# Patient Record
Sex: Male | Born: 1980 | Hispanic: Yes | Marital: Married | Smoking: Never smoker
Health system: Southern US, Community
[De-identification: ages and names within clinical notes are randomized; demographics above are authoritative.]

## PROBLEM LIST (undated history)

## (undated) DIAGNOSIS — G4710 Hypersomnia, unspecified: Secondary | ICD-10-CM

## (undated) DIAGNOSIS — R55 Syncope and collapse: Secondary | ICD-10-CM

## (undated) HISTORY — DX: Hypersomnia, unspecified: G47.10

## (undated) HISTORY — DX: Syncope and collapse: R55

---

## 2016-10-20 ENCOUNTER — Ambulatory Visit (INDEPENDENT_AMBULATORY_CARE_PROVIDER_SITE_OTHER): Payer: Worker's Compensation

## 2016-10-20 ENCOUNTER — Ambulatory Visit (INDEPENDENT_AMBULATORY_CARE_PROVIDER_SITE_OTHER): Payer: Worker's Compensation | Admitting: Orthopaedic Surgery

## 2016-10-20 DIAGNOSIS — M5412 Radiculopathy, cervical region: Secondary | ICD-10-CM

## 2016-10-20 DIAGNOSIS — M25511 Pain in right shoulder: Secondary | ICD-10-CM

## 2016-10-20 NOTE — Progress Notes (Signed)
Office Visit Note   Patient: Roger Garrett           Date of Birth: 1980/07/15           MRN: [id number] Visit Date: 10/20/2016              Requested by: No referring provider defined for this encounter. PCP: Patient, No Pcp Per   Assessment & Plan: Visit Diagnoses:  1. Acute pain of right shoulder   2. Radiculopathy, cervical region     Plan: Given his physical exam findings of weakness in the right shoulder combined with the failure of conservative treatment and MRI is warranted to rule out a rotator cuff tear. I will keep him on light duty work with no lifting greater than 5 pounds for the right upper extremity. I will try combination of a Medrol Dosepak, diclofenac, and tramadol to see this will help but the symptoms while we await the MRI. All questions were encouraged and answered and we did work and interpreter as well.  Follow-Up Instructions: Return in about 2 weeks (around 11/03/2016).   Orders:  Orders Placed This Encounter  Procedures  . XR Shoulder Right  . XR Cervical Spine 2 or 3 views   No orders of the defined types were placed in this encounter.     Procedures: No procedures performed   Clinical Data: No additional findings.   Subjective: No chief complaint on file. Patient is a very pleasant 36 year old non-English speaking gentleman with acute right shoulder pain for about a month after work-related injury. He was lifting a heavy pallet we set it down to get acute pain and strain in his right shoulder. He's tried rest, ice, heat, anti-inflammatories, activity modification and a steroid injection. His pain is still daily. It is detrimentally affected his activities daily living and his quality of life. It hurts always reaching overhead into the side. It hurts reaching behind him. The shoulder he says is weak. He also has pain rating down to his hand.  HPI  Review of Systems He currently denies any headache, chest pain, nausea, vomiting, fever,  chills  Objective: Vital Signs: There were no vitals taken for this visit.  Physical Exam He is alert and oriented 3 in no acute distress Ortho Exam Examination of his right shoulder does show weakness with abduction and external rotation. He has 5 out of 5 strength in his biceps triceps as well as wrist and hand. He has normal sensation today. His hand is well-perfused. The shoulder hurts extensively with overhead activities. It hurts all around the subdeltoid subacromial area of the shoulder itself. His neck exam is entirely normal with negative Spurling's sign. His neck range of motion is full as well. The shoulder itself as well as located. Specialty Comments:  No specialty comments available.  Imaging: Xr Cervical Spine 2 Or 3 Views  Result Date: 10/20/2016 2 views of the cervical spine AP and lateral show normal alignment with no acute findings.  Xr Shoulder Right  Result Date: 10/20/2016 3 views of the right shoulder including AP outlet and axillary show well located shoulder with no acute findings or malalignment.    PMFS History: There are no active problems to display for this patient.  No past medical history on file.  No family history on file.  No past surgical history on file. Social History   Occupational History  . Not on file.   Social History Main Topics  . Smoking status: Not on  file  . Smokeless tobacco: Not on file  . Alcohol use Not on file  . Drug use: Unknown  . Sexual activity: Not on file

## 2016-10-21 ENCOUNTER — Other Ambulatory Visit (INDEPENDENT_AMBULATORY_CARE_PROVIDER_SITE_OTHER): Payer: Self-pay

## 2016-10-21 DIAGNOSIS — M25511 Pain in right shoulder: Secondary | ICD-10-CM

## 2016-11-03 ENCOUNTER — Ambulatory Visit (INDEPENDENT_AMBULATORY_CARE_PROVIDER_SITE_OTHER): Payer: Worker's Compensation | Admitting: Orthopaedic Surgery

## 2016-11-09 ENCOUNTER — Ambulatory Visit
Admission: RE | Admit: 2016-11-09 | Discharge: 2016-11-09 | Disposition: A | Discharge status: Home / Self Care | Payer: Worker's Compensation | Source: Ambulatory Visit | Attending: Orthopaedic Surgery | Admitting: Orthopaedic Surgery

## 2016-11-09 DIAGNOSIS — M25511 Pain in right shoulder: Secondary | ICD-10-CM

## 2016-11-15 ENCOUNTER — Ambulatory Visit (INDEPENDENT_AMBULATORY_CARE_PROVIDER_SITE_OTHER): Payer: Worker's Compensation | Admitting: Orthopaedic Surgery

## 2016-11-15 DIAGNOSIS — M25511 Pain in right shoulder: Secondary | ICD-10-CM

## 2016-11-15 HISTORY — DX: Pain in right shoulder: M25.511

## 2016-11-15 MED ORDER — METHYLPREDNISOLONE ACETATE 40 MG/ML IJ SUSP
40.0000 mg | INTRAMUSCULAR | Status: AC | PRN
  Administered 2016-11-15: 40 mg via INTRA_ARTICULAR

## 2016-11-15 MED ORDER — LIDOCAINE HCL 1 % IJ SOLN
3.0000 mL | INTRAMUSCULAR | Status: AC | PRN
  Administered 2016-11-15: 3 mL

## 2016-11-15 NOTE — Progress Notes (Signed)
Office Visit Note   Patient: Roger Garrett           Date of Birth: 10/10/1980           MRN: [id number] Visit Date: 11/15/2016              Requested by: No referring provider defined for this encounter. PCP: Patient, No Pcp Per   Assessment & Plan: Visit Diagnoses:  1. Acute pain of right shoulder     Plan: He'll continue his work activity modification and we'll try to get him set up for physical therapy for his right shoulder. I did provide another subacromial steroid injection to see if this will help as well. I like see him back in just 2 weeks to see if injections helped and hopefully will add some physical therapy by then. Hopefully we'll be able to eventually get him back to full work duties in anywhere from 48 weeks from now.  Follow-Up Instructions: Return in about 2 weeks (around 11/29/2016).   Orders:  No orders of the defined types were placed in this encounter.  No orders of the defined types were placed in this encounter.     Procedures: Large Joint Inj Date/Time: 11/15/2016 3:38 PM Performed by: Kathryne HitchBLACKMAN, Gil Ingwersen Y Authorized by: Kathryne HitchBLACKMAN, Montavius Subramaniam Y   Location:  Shoulder Site:  R subacromial bursa Ultrasound Guidance: No   Fluoroscopic Guidance: No   Arthrogram: No   Medications:  3 mL lidocaine 1 %; 40 mg methylPREDNISolone acetate 40 MG/ML     Clinical Data: No additional findings.   Subjective: No chief complaint on file. The patient is a very pleasant 36 year old non-English speaking right-hand dominant male whose return for follow-up after MRI of his right shoulder. 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 Since then he's been on more a modified light duty work and he still has pain daily in his shoulder. Was sent him for an MRI due to failure conservative treatment including rest, ice, heat, anti-inflammatories and a steroid injection was done elsewhere. He still reports pain  in his shoulder mainly with overhead activities. He also occasionally gets numbness and tingling in his hand he feels like the pain radiates from his shoulder into his neck and down into his hand.  HPI  Review of Systems He currently denies any headache, chest pain, shortness of breath, fever, chills, nausea, vomiting.  Objective: Vital Signs: There were no vitals taken for this visit.  Physical Exam He is alert and oriented 3 and in no acute distress Ortho Exam Examination of his right shoulder shows minimal pain is moving it more fluidly Specialty Comments:  No specialty comments available.  Imaging: No results found. The MRI of his right shoulders independently reviewed and shows tendinosis of the rotator cuff with no tear. There is mild acromioclavicular arthritic changes and subacromial and subdeltoid bursitis. There is no fracture and again the rotator cuff shows no tear.  PMFS History: Patient Active Problem List   Diagnosis Date Noted  . Acute pain of right shoulder 11/15/2016   No past medical history on file.  No family history on file.  No past surgical history on file. Social History   Occupational History  . Not on file.   Social History Main Topics  . Smoking status: Not on file  . Smokeless tobacco: Not on file  . Alcohol use Not on file  . Drug use: Unknown  . Sexual activity: Not  on file

## 2016-11-17 ENCOUNTER — Telehealth (INDEPENDENT_AMBULATORY_CARE_PROVIDER_SITE_OTHER): Payer: Self-pay

## 2016-11-17 ENCOUNTER — Other Ambulatory Visit (INDEPENDENT_AMBULATORY_CARE_PROVIDER_SITE_OTHER): Payer: Self-pay

## 2016-11-17 NOTE — Telephone Encounter (Signed)
Pt needs out patient physical therapy for right shoulder to treat tendonitis and bursitis, impingement this is a workers comp pt.

## 2016-11-18 ENCOUNTER — Other Ambulatory Visit (INDEPENDENT_AMBULATORY_CARE_PROVIDER_SITE_OTHER): Payer: Self-pay

## 2016-11-18 DIAGNOSIS — G8929 Other chronic pain: Secondary | ICD-10-CM

## 2016-11-18 DIAGNOSIS — M25511 Pain in right shoulder: Principal | ICD-10-CM

## 2016-11-18 NOTE — Telephone Encounter (Signed)
Faxed office note and PT request to adj Vicky @ Fax # [phone] 

## 2016-11-18 NOTE — Telephone Encounter (Signed)
[facility] Fax # [phone]

## 2016-11-18 NOTE — Telephone Encounter (Signed)
Order is in chart for therapy. [facility] [address] [facility]. Let me know if there is anything else that you need me to do. Thanks!

## 2016-11-29 ENCOUNTER — Ambulatory Visit (INDEPENDENT_AMBULATORY_CARE_PROVIDER_SITE_OTHER): Payer: Worker's Compensation | Admitting: Orthopaedic Surgery

## 2016-11-29 DIAGNOSIS — M25511 Pain in right shoulder: Secondary | ICD-10-CM | POA: Diagnosis not present

## 2016-11-29 NOTE — Progress Notes (Signed)
The patient is a very pleasant 36 year old right-hand-dominant male who is following up for his right shoulder. He someone had in follow-up status post a work-related injury to his right dominant shoulder. 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 At his last visit we get him set up for physical therapy and he's been to several appointments. We also provided a subacromial injection he said that helped. He is not pain-free but is been careful with the shoulder. He's been working on a home exercise program and physical therapy and is been slowly improving.  On examination his rotator cuff is strong on the right shoulder his range of motion is full but painful to be expected in the subacromial area.  At this point he'll continue physical therapy. I will keep him on light duty work. I'll have him come back for one more pulmonary 4 weeks. He still having some residual pain I was certainly consider 1 more subacromial injection. We'll see how he is doing in 4 weeks. All questions were encouraged and answered. An interpreter was used as well.

## 2016-12-02 ENCOUNTER — Other Ambulatory Visit (INDEPENDENT_AMBULATORY_CARE_PROVIDER_SITE_OTHER): Payer: Self-pay | Admitting: Orthopaedic Surgery

## 2016-12-23 ENCOUNTER — Telehealth (INDEPENDENT_AMBULATORY_CARE_PROVIDER_SITE_OTHER): Payer: Self-pay | Admitting: Orthopaedic Surgery

## 2016-12-23 NOTE — Telephone Encounter (Signed)
Plan of Care faxed 12/22/16 °

## 2016-12-27 ENCOUNTER — Ambulatory Visit (INDEPENDENT_AMBULATORY_CARE_PROVIDER_SITE_OTHER): Payer: Worker's Compensation | Admitting: Orthopaedic Surgery

## 2016-12-27 DIAGNOSIS — M25511 Pain in right shoulder: Secondary | ICD-10-CM

## 2016-12-27 NOTE — Progress Notes (Signed)
The patient is now between 3 and 4 months status post a work-related shoulder injury to his right shoulder. He's been to 11 physical therapy sessions and reports excellent progress and minimal pain at this point with his shoulder. He reports no shoulder dysfunction. He is sleeping better as well.  On examination his right shoulder shows 5 out of 5 strength of rotator cuff. His liftoff is negative. He shows no signs of impingement or deformity of the right shoulder all.  At this point I gave him a note to resume full work activities starting 01/03/2017. All questions were encouraged and answered. There is no permanent disability as a relates to his right shoulder. He'll follow-up as needed.

## 2017-01-04 ENCOUNTER — Telehealth (INDEPENDENT_AMBULATORY_CARE_PROVIDER_SITE_OTHER): Payer: Self-pay

## 2017-01-04 NOTE — Telephone Encounter (Signed)
Faxed 12/27/16 office note to adjustor per her request

## 2021-02-12 ENCOUNTER — Ambulatory Visit: Payer: Self-pay

## 2021-02-12 ENCOUNTER — Other Ambulatory Visit: Payer: Self-pay | Admitting: Family Medicine

## 2021-02-12 ENCOUNTER — Other Ambulatory Visit: Payer: Self-pay

## 2021-02-12 DIAGNOSIS — M25511 Pain in right shoulder: Secondary | ICD-10-CM

## 2022-01-18 IMAGING — DX DG SHOULDER 2+V*R*
3 series · 3 of 3 positions shown · non-contrast
Comparison: None.

CLINICAL DATA: pain right shoulder

EXAM:
RIGHT SHOULDER - 2+ VIEW

[shoulder ap]
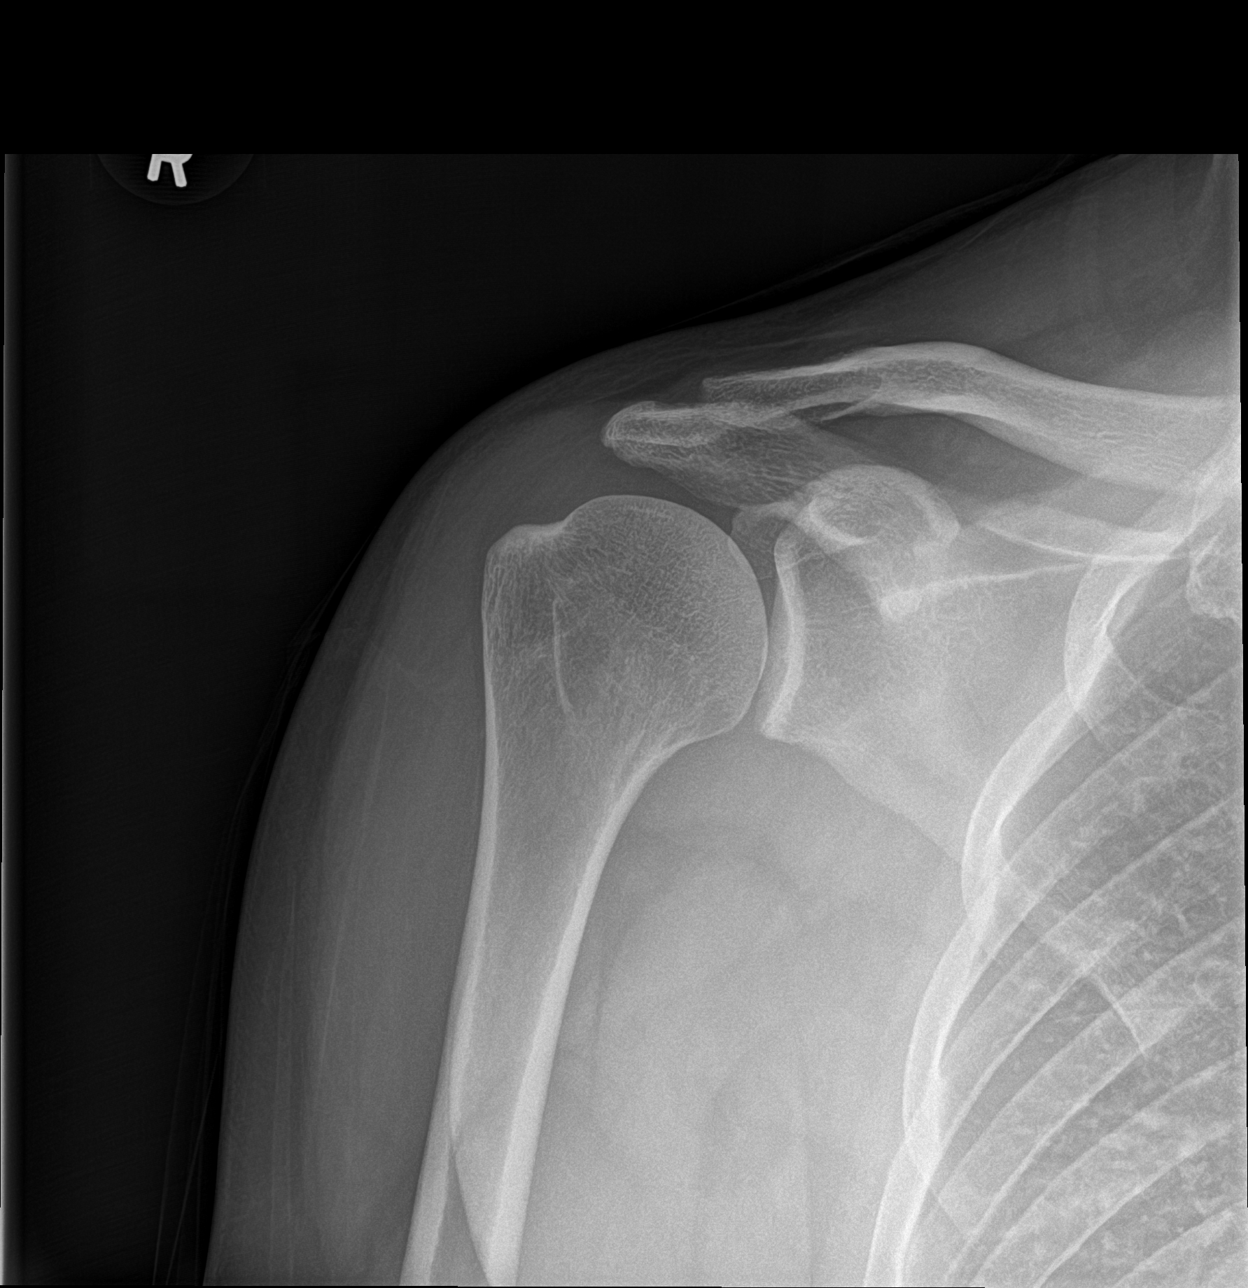

[shoulder y-view]
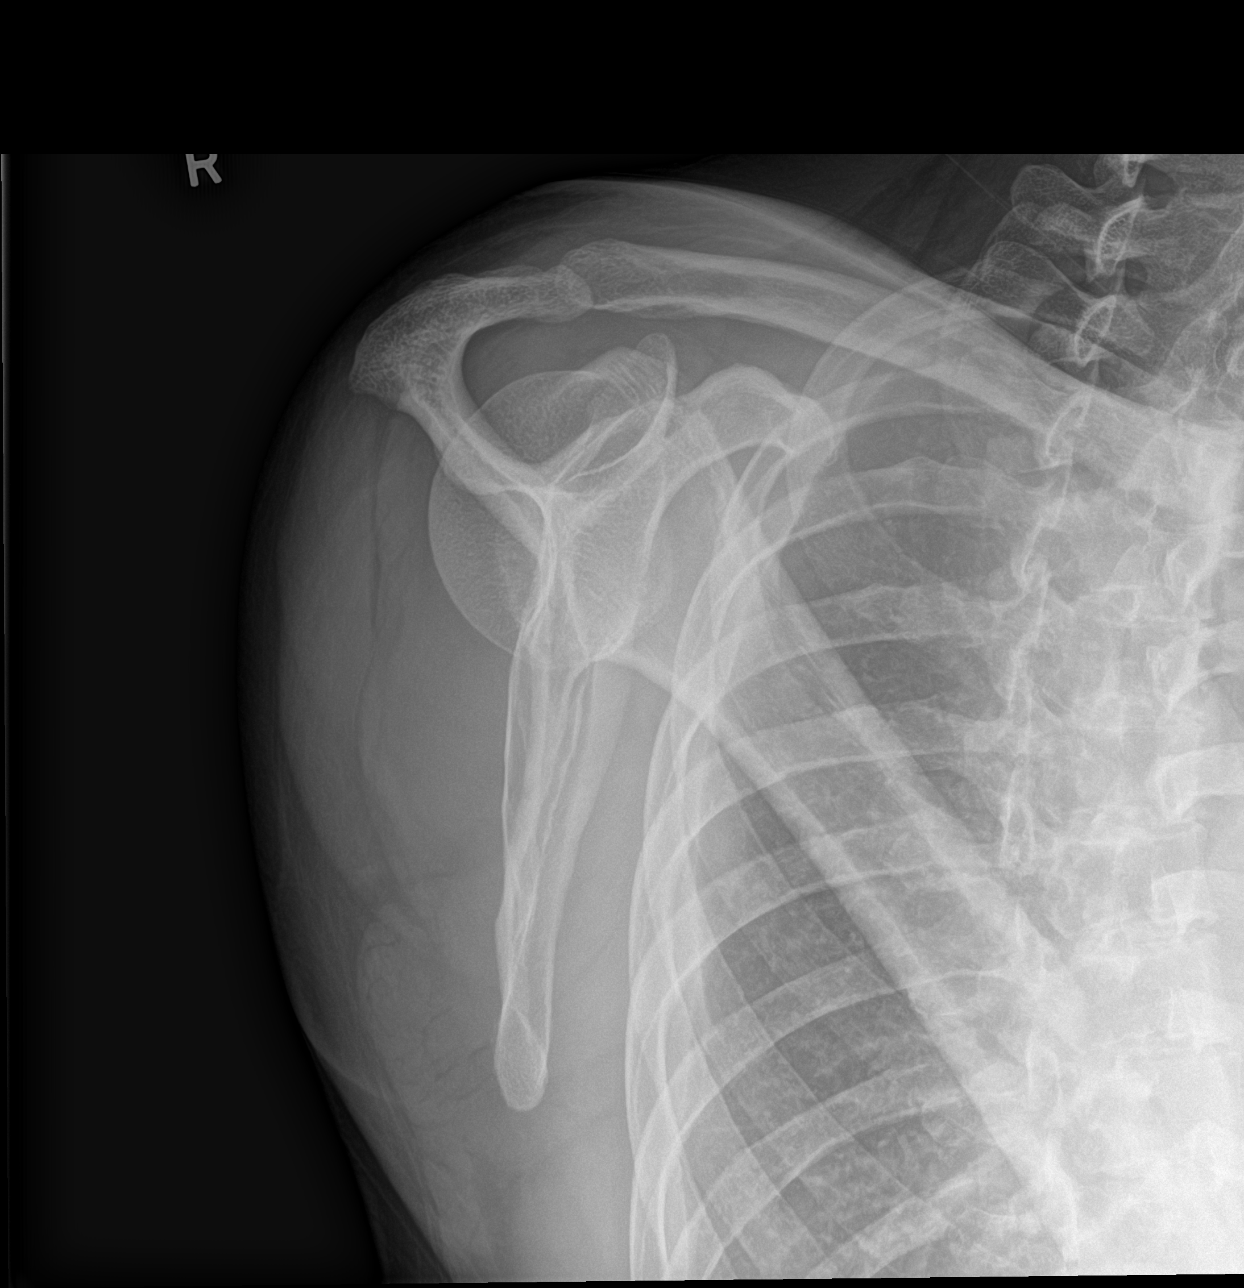

[shoulder axial]
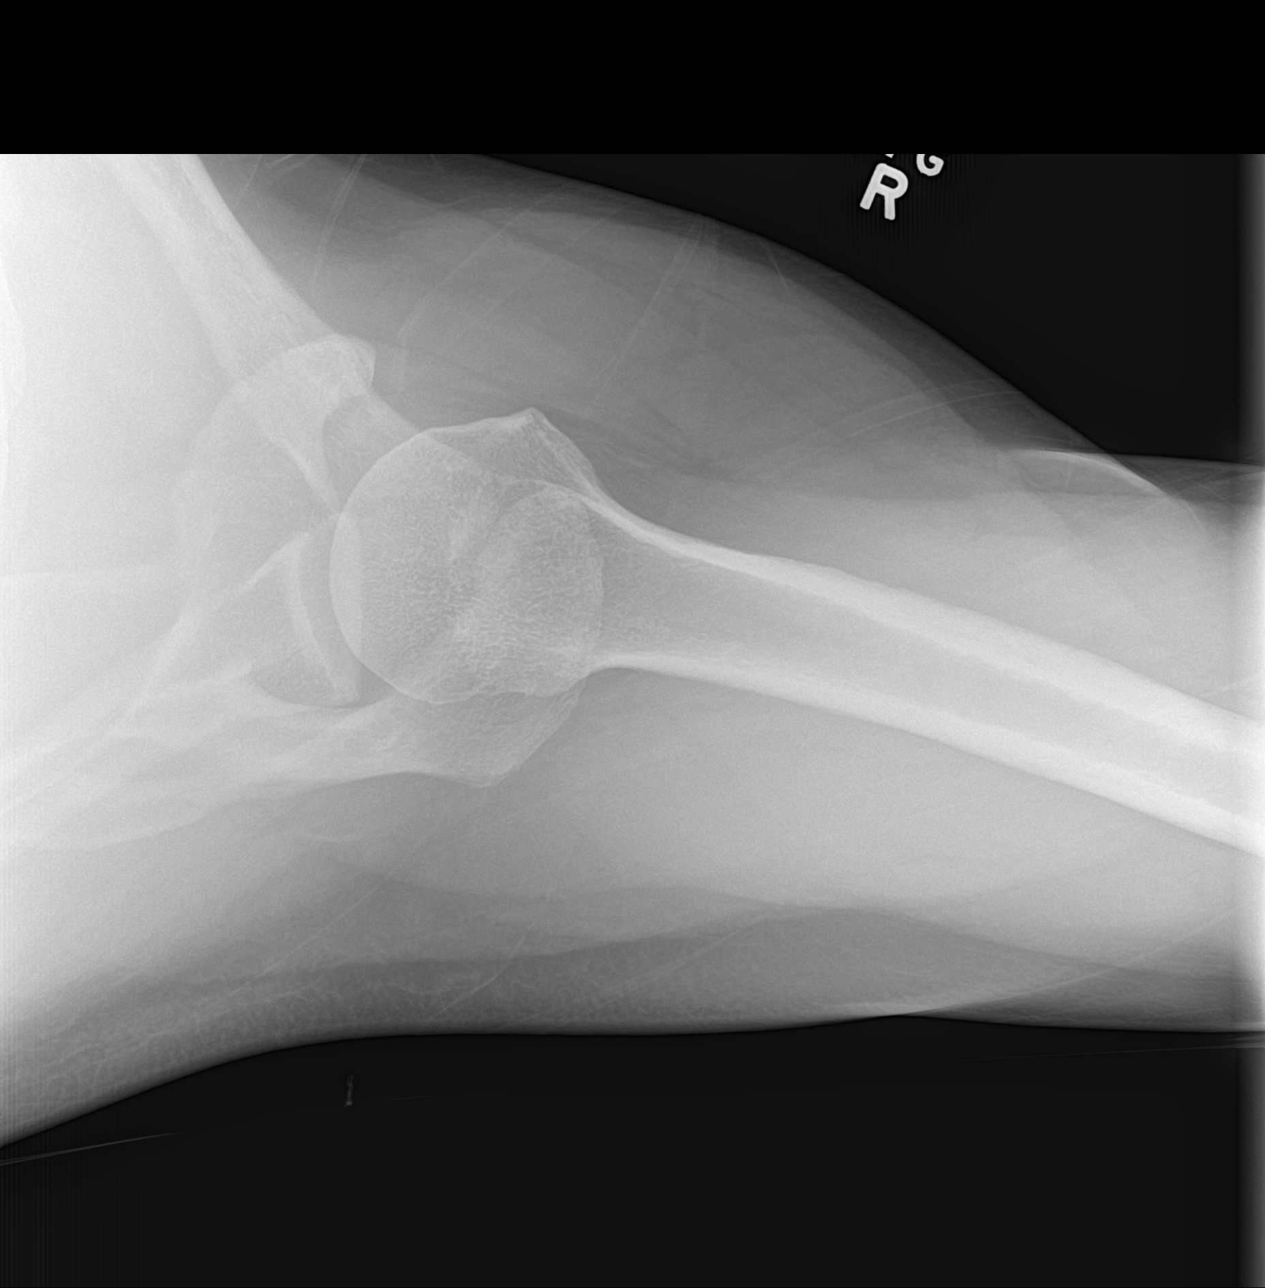

[3 of 3 positions shown; findings below may reference images not displayed]

FINDINGS: Likely projectional irregularity of the distal clavicle on the
frontal view, as the distal clavicle appears normal on the axillary
and scapular Y-view. There is no other evidence of acute fracture.
There is no dislocation. There is no significant degenerative
change.
IMPRESSION: Irregularity of the distal clavicle on the frontal view is likely a
projectional anomaly, correlate with point tenderness.

Normal glenohumeral alignment.  No significant arthritis.

## 2023-08-30 ENCOUNTER — Encounter: Payer: Self-pay | Admitting: *Deleted

## 2023-09-05 DIAGNOSIS — G4710 Hypersomnia, unspecified: Secondary | ICD-10-CM | POA: Insufficient documentation

## 2023-09-05 DIAGNOSIS — R55 Syncope and collapse: Secondary | ICD-10-CM | POA: Insufficient documentation

## 2023-09-07 ENCOUNTER — Ambulatory Visit: Payer: Self-pay

## 2023-09-07 VITALS — BP 132/64 | HR 70 | Ht 74.0 in | Wt 259.6 lb

## 2023-09-07 DIAGNOSIS — R55 Syncope and collapse: Secondary | ICD-10-CM

## 2023-09-07 DIAGNOSIS — G4710 Hypersomnia, unspecified: Secondary | ICD-10-CM

## 2023-09-07 DIAGNOSIS — F17210 Nicotine dependence, cigarettes, uncomplicated: Secondary | ICD-10-CM | POA: Insufficient documentation

## 2023-09-07 NOTE — Assessment & Plan Note (Signed)
 Advised about the harmful effects of smoking and strongly recommended to quit. He is understanding of this and is willing to work on it.

## 2023-09-07 NOTE — Assessment & Plan Note (Signed)
 Appears likely related to his sleep disordered breathing causing daytime somnolence. Pending workup for sleep apnea as ordered by PCP.

## 2023-09-07 NOTE — Patient Instructions (Addendum)
 Medication Instructions:  Your physician recommends that you continue on your current medications as directed. Please refer to the Current Medication list given to you today.  *If you need a refill on your cardiac medications before your next appointment, please call your pharmacy*  Lab Work: None If you have labs (blood work) drawn today and your tests are completely normal, you will receive your results only by: MyChart Message (if you have MyChart) OR A paper copy in the mail If you have any lab test that is abnormal or we need to change your treatment, we will call you to review the results.  Testing/Procedures: A zio monitor was ordered today. It will remain on for 14 days. You will then return monitor and event diary in provided box. It takes 1-2 weeks for report to be downloaded and returned to [place]. We will call you with the results. If monitor falls off or has orange flashing light, please call Zio for further instructions.    Your physician has requested that you have an echocardiogram. Echocardiography is a painless test that uses sound waves to create images of your heart. It provides your doctor with information about the size and shape of your heart and how well your heart's chambers and valves are working. This procedure takes approximately one hour. There are no restrictions for this procedure. Please do NOT wear cologne, perfume, aftershave, or lotions (deodorant is allowed). Please arrive 15 minutes prior to your appointment time.  Please note: We ask at that you not bring children with you during ultrasound (echo/ vascular) testing. Due to room size and safety concerns, children are not allowed in the ultrasound rooms during exams. Our front office staff cannot provide observation of children in our lobby area while testing is being conducted. An adult accompanying a patient to their appointment will only be allowed in the ultrasound room at the discretion of the ultrasound  technician under special circumstances. We apologize for any inconvenience.   Follow-Up: At [facility], you and your health needs are our priority.  As part of our continuing mission to provide you with exceptional heart care, our providers are all part of one team.  This team includes your primary Cardiologist (physician) and Advanced Practice Providers or APPs (Physician Assistants and Nurse Practitioners) who all work together to provide you with the care you need, when you need it.  Your next appointment:   Follow up will be determined after testing  Provider:   Huntley Dec, MD    We recommend signing up for the patient portal called "MyChart".  Sign up information is provided on this After Visit Summary.  MyChart is used to connect with patients for Virtual Visits (Telemedicine).  Patients are able to view lab/test results, encounter notes, upcoming appointments, etc.  Non-urgent messages can be sent to your provider as well.   To learn more about what you can do with MyChart, go to [email or web address].   Other Instructions No Smoking

## 2023-09-07 NOTE — Assessment & Plan Note (Signed)
 His description of syncopal episode was preceded by palpitations with fast heartbeat sensation. Reportedly had similar symptoms multiple times in the past when he was working night shifts.  Has had no significant episodes since he switched to his current job.  -EKG unremarkable. -extended heart monitoring with a 30-day event monitor discussed and he is agreeable. If this is unremarkable can consider loop recorder. - Recommended to keep his appointment for sleep study as this could trigger significant daytime sleepiness and can also trigger arrhythmias notably supraventricular ectopy.  Will obtain transthoracic echocardiogram for cardiac structure and function assessment.

## 2023-09-07 NOTE — Progress Notes (Signed)
 Cardiology Consultation:    Date:  09/07/2023   ID:  Rayon, Mcchristian [birth year], MRN [id number]  PCP:  Patient, No Pcp Per  Cardiologist:  Luretha Murphy, MD   Referring MD: Baldemar Lenis, MD   No chief complaint on file.    ASSESSMENT AND PLAN:   Mr regie bunner 43 year old male with history of smoking and increased snoring episode over the past few months associated with increased sleepiness during the daytime most notably during driving, also reports symptoms of palpitations which were frequent when he worked night shifts years ago but more recently reports couple episodes 1 with syncope about 2 months ago and another episode of palpitations associated with lightheadedness after he cut his fingers while working in the house.   Problem List Items Addressed This Visit     Hypersomnolence disorder - Primary   Appears likely related to his sleep disordered breathing causing daytime somnolence. Pending workup for sleep apnea as ordered by PCP.      Syncope   His description of syncopal episode was preceded by palpitations with fast heartbeat sensation. Reportedly had similar symptoms multiple times in the past when he was working night shifts.  Has had no significant episodes since he switched to his current job.  -EKG unremarkable. -extended heart monitoring with a 30-day event monitor discussed and he is agreeable. If this is unremarkable can consider loop recorder. - Recommended to keep his appointment for sleep study as this could trigger significant daytime sleepiness and can also trigger arrhythmias notably supraventricular ectopy.  Will obtain transthoracic echocardiogram for cardiac structure and function assessment.       Relevant Orders   EKG 12-Lead (Completed)   Smoking 1/2 pack a day or less   Advised about the harmful effects of smoking and strongly recommended to quit. He is understanding of this and is willing to work on it.       Return  to clinic based on test results.   History of Present Illness:    Laurence Lemus de Drue Novel is a 43 y.o. male who is being seen today for the evaluation of syncope at the request of Baldemar Lenis, MD.  Pleasant man, Spanish-speaking individual seen today for the visit accompanied by his wife. Spanish interpreter was present in person for this encounter. Lives with his wife at home.  Does work in Aeronautical engineer.  He reports no significant prior cardiac history. No longstanding health issues. Currently not on any medications.  About 2 months ago he reports an episode of palpitations which he describes as a sense of fast heartbeat occurred spontaneously around 10 to 11 PM in the night, associated with lightheadedness and he was unable to bring himself down to the floor quickly and passed out for few minutes.  He reports another episode of palpitations associated with lightheadedness about a month ago as he was moving furniture in the house he cut himself on a wheelbarrow and was bleeding from his fingers, started feeling fast heartbeat and lightheaded.  This lasted for about 5 minutes.  He reports he used to have episodes of palpitations with fast heartbeat and at times associated with lightheadedness and near syncopal episodes about 10 years ago when he was working mostly night shifts and back then with the onset of episodes he used to do sit down or lay down immediately until symptoms passed.  He also reports having gained some weight over the past couple years.  Snoring significantly overnight and wakes himself  up.  Found himself dozing off easily with short distances of driving and has to keep himself alert by chewing gum. Tells me that he is in the process of getting sleep study done, tentatively scheduled for later part of this month.  Reportedly had been falling asleep while driving more noticeably over the past 6 months.  Associated with daytime sleepiness and nocturnal sleep  disturbances.  Does smoke up to 7 cigarettes a day. Social alcohol consumption occasionally on weekends. No recreational or illicit drug use.  EKG in the clinic today shows sinus rhythm heart rate 70/min, PR interval 134 ms, QRS duration 88 ms, QTc 414 ms normal.   Blood work to review from [facility] 07-31-2023 reviewed hemoglobin 15.9, hematocrit 46.2, WBC 7.8, platelets 222. Sodium 136, potassium 4.5, BUN 17, creatinine 0.8 Normal transaminases and alkaline phosphatase.  Past Medical History:  Diagnosis Date   Acute pain of right shoulder 11/15/2016   Hypersomnolence disorder    Syncope     History reviewed. No pertinent surgical history.  Current Medications: No outpatient medications have been marked as taking for the 09/07/23 encounter (Office Visit) with Jackson Coffield, Marlyn Corporal, MD.     Allergies:   Patient has no known allergies.   Social History   Socioeconomic History   Marital status: Married    Spouse name: Not on file   Number of children: Not on file   Years of education: Not on file   Highest education level: Not on file  Occupational History   Not on file  Tobacco Use   Smoking status: Every Day    Current packs/day: 1.00    Average packs/day: 1 pack/day for 7.0 years (7.0 ttl pk-yrs)    Types: Cigarettes    Passive exposure: Never   Smokeless tobacco: Never  Substance and Sexual Activity   Alcohol use: Not Currently    Alcohol/week: 2.0 standard drinks of alcohol    Types: 2 Standard drinks or equivalent per week   Drug use: Never   Sexual activity: Not on file  Other Topics Concern   Not on file  Social History Narrative   Not on file   Social Drivers of Health   Financial Resource Strain: Not at Risk (08/11/2023)   Received from [employer]    Financial Resource Strain: 1  Food Insecurity: Not at Risk (08/11/2023)   Received from [facility] Insecurity    Food: 1  Transportation Needs: Not at Risk  (08/11/2023)   Received from [facility] Needs    Transportation: 1  Physical Activity: At Risk (08/11/2023)   Received from [facility]   Physical Activity    Physical Activity: 2  Stress: Not at Risk (08/11/2023)   Received from [facility]   Stress    Stress: 1  Social Connections: Not at Risk (08/11/2023)   Received from [facility]   Social Connections    Connectedness: 1     Family History: The patient's family history includes Cancer in his maternal grandfather; Colon cancer in his maternal uncle; Diabetes in his mother. ROS:   Please see the history of present illness.    All 14 point review of systems negative except as described per history of present illness.  EKGs/Labs/Other Studies Reviewed:    The following studies were reviewed today:   EKG:  EKG Interpretation Date/Time:  Wednesday September 07 2023 13:50:25 EDT Ventricular Rate:  70 PR Interval:  134 QRS Duration:  88 QT  Interval:  384 QTC Calculation: 414 R Axis:   58  Text Interpretation: Normal sinus rhythm Normal ECG No previous ECGs available Confirmed by Huntley Dec reddy [phone]) on 09/07/2023 2:35:31 PM    Recent Labs: No results found for requested labs within last 365 days.  Recent Lipid Panel No results found for: "CHOL", "TRIG", "HDL", "CHOLHDL", "VLDL", "LDLCALC", "LDLDIRECT"  Physical Exam:    VS:  BP 132/64   Pulse 70   Ht 6\' 2"  (1.88 m)   Wt 259 lb 9.6 oz (117.8 kg)   SpO2 95%   BMI 33.33 kg/m     Wt Readings from Last 3 Encounters:  09/07/23 259 lb 9.6 oz (117.8 kg)  08/11/23 256 lb (116.1 kg)     GENERAL:  Well nourished, well developed in no acute distress NECK: No JVD; No carotid bruits CARDIAC: RRR, S1 and S2 present, no murmurs, no rubs, no gallops CHEST:  Clear to auscultation without rales, wheezing or rhonchi  Extremities: No pitting pedal edema. Pulses bilaterally symmetric with radial 2+ and dorsalis pedis 2+ NEUROLOGIC:  Alert and oriented x 3  Medication  Adjustments/Labs and Tests Ordered: Current medicines are reviewed at length with the patient today.  Concerns regarding medicines are outlined above.  Orders Placed This Encounter  Procedures   EKG 12-Lead   No orders of the defined types were placed in this encounter.   Signed, Cecille Amsterdam, MD, MPH, [facility]. 09/07/2023 2:40 PM    [facility]

## 2023-10-04 ENCOUNTER — Ambulatory Visit: Payer: Self-pay
